# Patient Record
Sex: Female | Born: 1935 | Race: White | Hispanic: No | Smoking: Former smoker
Health system: Southern US, Community
[De-identification: ages and names within clinical notes are randomized; demographics above are authoritative.]

## PROBLEM LIST (undated history)

## (undated) DIAGNOSIS — S22080A Wedge compression fracture of T11-T12 vertebra, initial encounter for closed fracture: Secondary | ICD-10-CM

---

## 2016-07-14 ENCOUNTER — Other Ambulatory Visit: Payer: Self-pay | Admitting: Specialist

## 2016-07-14 DIAGNOSIS — S22080A Wedge compression fracture of T11-T12 vertebra, initial encounter for closed fracture: Secondary | ICD-10-CM

## 2016-07-15 ENCOUNTER — Ambulatory Visit
Admission: RE | Admit: 2016-07-15 | Discharge: 2016-07-15 | Disposition: A | Discharge status: Home / Self Care | Payer: Medicare Other | Source: Ambulatory Visit | Attending: Specialist | Admitting: Specialist

## 2016-07-15 DIAGNOSIS — S22080A Wedge compression fracture of T11-T12 vertebra, initial encounter for closed fracture: Secondary | ICD-10-CM

## 2016-07-15 HISTORY — DX: Wedge compression fracture of T11-T12 vertebra, initial encounter for closed fracture: S22.080A

## 2016-07-15 HISTORY — PX: IR GENERIC HISTORICAL: IMG1180011

## 2016-07-15 NOTE — Consult Note (Signed)
Chief Complaint  Patient presents with  . Advice Only    Consult for T11 compression fx/poss Kyphoplasty      Referring Physician(s): Hill,Edward  History of Present Illness: Brandi Dodson is a 83 y.o. female past medical history significant for osteopenia, who is known to the IR service after undergoing technically successful T8, T10 and T12 fluoroscopic guided kyphoplasties on 05/18/2016.  She returns today for evaluation and management for new symptomatic compression fractures. She is accompanied by her caregiver though serves as her on historian.  Patient states that she experienced a significant improvement in her back pain following the multilevel kyphoplasty performed on 05/18/2016. Unfortunately in early December while being transferred for the acquisition of the cervical spine radiographs, the patient experienced a sudden onset of new lower thoracic/upper lumbar back pain similar to her prior compression fractures.  The patient has been treated conservatively however given persistence of symptoms, a lumbar spine CT was obtained on 06/30/2016 demonstrating mild compression deformities involving the superior endplates of the T11 and L2 vertebral bodies.   The patient states that this pain is similar to her previous compression fractures. She rates her pain as 4-6 / 10 though states it acutely worsens with movement or ambulation.  She states that her pain is minimally improved with pain medicine though she does not like taking pain medicine as it affects her mentation.  The patient is otherwise without complaint. No fever or chills. No lower extremity weakness. No radiculopathy symptoms.    Past Medical History:  Diagnosis Date  . Closed wedge compression fracture of T11 vertebra (HCC)     No past surgical history on file.  Allergies: Penicillins; Amitriptyline; Amlodipine besylate; Ciprofloxacin; Codeine; Dicyclomine; Erythromycin; Feldene [piroxicam]; Histex [triprolidine hcl];  Influenza vaccines; Lyrica [pregabalin]; Macrobid [nitrofurantoin]; Mouthwashes; Myrbetriq [mirabegron]; Neurontin [gabapentin]; Novocain [procaine]; Phenylephrine-guaifenesin; Prednisolone; Prednisone; Propoxyphene; Spironolactone; Trovan [alatrofloxacin]; Amaranth (fd&c red #2); Clindamycin/lincomycin; Iodinated diagnostic agents; and Vancomycin  Medications: Prior to Admission medications   Medication Sig Start Date End Date Taking? Authorizing Provider  aspirin 81 MG EC tablet Take 81 mg by mouth daily. Swallow whole.   Yes Historical Provider, MD  atorvastatin (LIPITOR) 40 MG tablet Take 40 mg by mouth daily.   Yes Historical Provider, MD  butalbital-acetaminophen-caffeine (FIORICET WITH CODEINE) 50-325-40-30 MG capsule Take 1 capsule by mouth every 4 (four) hours as needed for headache.   Yes Historical Provider, MD  chlordiazePOXIDE (LIBRIUM) 5 MG capsule Take 5 mg by mouth 2 (two) times daily.   Yes Historical Provider, MD  clopidogrel (PLAVIX) 75 MG tablet Take 75 mg by mouth daily.   Yes Historical Provider, MD  CRANBERRY FRUIT CONCENTRATE PO Take 450 mg by mouth 2 (two) times daily.   Yes Historical Provider, MD  diazepam (VALIUM) 5 MG tablet Take 5 mg by mouth every 8 (eight) hours as needed for anxiety.   Yes Historical Provider, MD  hydrALAZINE (APRESOLINE) 25 MG tablet Take 25 mg by mouth 3 (three) times daily.   Yes Historical Provider, MD  hydrocortisone (CORTEF) 10 MG tablet Take 10 mg by mouth 2 (two) times daily.   Yes Historical Provider, MD  levothyroxine (SYNTHROID, LEVOTHROID) 112 MCG tablet Take 112 mcg by mouth daily before breakfast.   Yes Historical Provider, MD  metoprolol succinate (TOPROL-XL) 50 MG 24 hr tablet Take 50 mg by mouth 2 (two) times daily. Take with or immediately following a meal.   Yes Historical Provider, MD  Multiple Vitamins-Minerals (PRESERVISION AREDS 2 PO) Take 1  tablet by mouth daily.   Yes Historical Provider, MD  oxyCODONE-acetaminophen  (PERCOCET/ROXICET) 5-325 MG tablet Take by mouth every 4 (four) hours as needed for severe pain.   Yes Historical Provider, MD  PROPYLENE GLYCOL OP Apply 1 drop to eye daily as needed.   Yes Historical Provider, MD  senna (SENOKOT) 8.6 MG TABS tablet Take 1 tablet by mouth daily as needed for mild constipation.   Yes Historical Provider, MD     No family history on file.  Social History   Social History  . Marital status: Widowed    Spouse name: N/A  . Number of children: N/A  . Years of education: N/A   Social History Main Topics  . Smoking status: Former Smoker    Packs/day: 0.75    Years: 5.00    Types: Cigarettes    Start date: 07/16/1947    Quit date: 07/15/1952  . Smokeless tobacco: Never Used  . Alcohol use No  . Drug use: No  . Sexual activity: Not Asked   Other Topics Concern  . None   Social History Narrative  . None    ECOG Status: 2 - Symptomatic, <50% confined to bed  Review of Systems: A 12 point ROS discussed and pertinent positives are indicated in the HPI above.  All other systems are negative.  Review of Systems  Constitutional: Positive for activity change. Negative for appetite change, fatigue and fever.  Respiratory: Negative.   Cardiovascular: Negative.   Gastrointestinal: Negative.   Musculoskeletal: Positive for back pain and gait problem.    Vital Signs: BP (!) 214/82 (BP Location: Left Arm, Patient Position: Sitting, Cuff Size: Normal)   Pulse 74   Temp 98.1 F (36.7 C) (Oral)   Resp 17   Ht 5' (1.524 m)   Wt 111 lb (50.3 kg)   SpO2 95%   BMI 21.68 kg/m   Physical Exam  Constitutional: She appears well-developed and well-nourished.  HENT:  Head: Normocephalic and atraumatic.  Cardiovascular: Normal rate and regular rhythm.   Pulmonary/Chest: Effort normal and breath sounds normal.  Musculoskeletal:       Arms: Location of patient's back pain. Patient is most tender with palpation of the lower thoracic/upper lumbar spine.    Psychiatric: She has a normal mood and affect. Her behavior is normal.  Nursing note and vitals reviewed.  Imaging:  Lumbar spine CT - 06/30/2016.  Fluoro Guided T8, T10 and T12 kyphoplasty - 05/18/2016  Labs:  CBC: No results for input(s): WBC, HGB, HCT, PLT in the last 8760 hours.  COAGS: No results for input(s): INR, APTT in the last 8760 hours.  BMP: No results for input(s): NA, K, CL, CO2, GLUCOSE, BUN, CALCIUM, CREATININE, GFRNONAA, GFRAA in the last 8760 hours.  Invalid input(s): CMP   Assessment and Plan:  Gudrun Axe is a 83 y.o. female past medical history significant for osteopenia, who is known to the IR service after undergoing technically successful T8, T10 and T12 fluoroscopic guided kyphoplasties on 05/18/2016.    While the patient experienced significant improvement in her back pain following the multilevel kyphoplasty performed 05/18/2016, unfortunately, she likely developed new compression fractures involving the superior endplates of the T11 and L2 vertebral bodies while being transferred for cervical spine radiographs in early December. Despite current conservative management, the patient's lifestyle limiting pain has persisted with CT performed 06/30/2016 demonstrated mild compression deformities involving the superior endplates of T11 and L2, both of which are symptomatic on physical examination.  As  such, prolonged conversations were held with the patient regarding the risks and benefits of fluoroscopic guided kyphoplasty including, but not limited to education regarding the natural healing process of compression fractures without intervention, bleeding, infection, cement migration which may cause spinal cord damage, paralysis, pulmonary embolism or even death.  All of the patient's questions were answered, patient is agreeable to proceed.  The procedure will be performed at [facility], however the patient is on Plavix which will need to be held  a minimum of 5 days prior to the procedure. Per the patient's son (I discussed the procedure with the patient's son via telephone at the time of the encounter), previous procedures entailed hospitalization prior to the intervention for inpatient administration of anticoagulant given patient's stroke risk. This will be discussed and arranged with referring physician, Dr. Loleta Chance.  While the patient prefers to have the procedure performed by myself, she is willing to have one of my other capable interventional radiology partners perform this procedure if scheduling cannot be arranged.  Thank you for this interesting consult.  I greatly enjoyed meeting Faithe Ariola and look forward to participating in their care.  A copy of this report was sent to the requesting provider on this date.  Electronically Signed: Simonne Come 07/15/2016, 2:07 PM   I spent a total of 25 Minutes in face to face in clinical consultation, greater than 50% of which was counseling/coordinating care for symptomatic compression fractures.

## 2016-07-17 ENCOUNTER — Encounter: Payer: Self-pay | Admitting: Interventional Radiology

## 2016-08-04 ENCOUNTER — Other Ambulatory Visit (HOSPITAL_COMMUNITY): Payer: Self-pay | Admitting: Interventional Radiology

## 2016-08-04 DIAGNOSIS — S22000A Wedge compression fracture of unspecified thoracic vertebra, initial encounter for closed fracture: Secondary | ICD-10-CM

## 2016-08-06 ENCOUNTER — Other Ambulatory Visit (HOSPITAL_COMMUNITY): Payer: Self-pay | Admitting: Interventional Radiology

## 2016-08-06 ENCOUNTER — Encounter: Payer: Self-pay | Admitting: Radiology

## 2016-08-06 ENCOUNTER — Ambulatory Visit
Admission: RE | Admit: 2016-08-06 | Discharge: 2016-08-06 | Disposition: A | Discharge status: Home / Self Care | Payer: Medicare Other | Source: Ambulatory Visit | Attending: Interventional Radiology | Admitting: Interventional Radiology

## 2016-08-06 DIAGNOSIS — G8929 Other chronic pain: Secondary | ICD-10-CM

## 2016-08-06 DIAGNOSIS — M546 Pain in thoracic spine: Principal | ICD-10-CM

## 2016-08-06 DIAGNOSIS — S22000A Wedge compression fracture of unspecified thoracic vertebra, initial encounter for closed fracture: Secondary | ICD-10-CM

## 2016-08-06 HISTORY — PX: IR GENERIC HISTORICAL: IMG1180011

## 2016-08-06 NOTE — Progress Notes (Signed)
Referring Physician(s): Woodyear,W  Chief Complaint: The patient is seen in follow up today s/p T11 and L2 kyphoplasties on 07/31/16 [facility])  History of present illness: Brandi Dodson is a 83 y.o. female with past medical history significant for osteopenia, who is known to the IR service after undergoing technically successful T8, T10 and T12 fluoroscopic guided kyphoplasties on 05/18/2016. She presents again today for follow up for recent T11 and L2 kyphoplasties on 07/31/16. She endorses some 3/10 lower left back pain that is worse with movement that began 2 days after procedure. She is eating well.  Denies chest pain, shortness of breath, fevers, chills, nausea, or vomiting. She was recently diagnosed with UTI but was unable to purchase prescribed antibiotic due to excess cost. She has had no recent falls.   Past Medical History:  Diagnosis Date  . Closed wedge compression fracture of T11 vertebra [facility])     Past Surgical History:  Procedure Laterality Date  . IR GENERIC HISTORICAL  07/15/2016   IR RADIOLOGIST EVAL & MGMT 07/15/2016 Simonne Come, MD [facility] INTERV RAD  . IR GENERIC HISTORICAL  08/06/2016   IR RADIOLOGIST EVAL & MGMT 08/06/2016 Simonne Come, MD [facility] INTERV RAD    Allergies: Penicillins; Amitriptyline; Amlodipine besylate; Ciprofloxacin; Codeine; Dicyclomine; Erythromycin; Feldene [piroxicam]; Histex [triprolidine hcl]; Influenza vaccines; Lyrica [pregabalin]; Macrobid [nitrofurantoin]; Mouthwashes; Myrbetriq [mirabegron]; Neurontin [gabapentin]; Novocain [procaine]; Phenylephrine-guaifenesin; Prednisolone; Prednisone; Propoxyphene; Spironolactone; Trovan [alatrofloxacin]; Amaranth (fd&c red #2); Clindamycin/lincomycin; Iodinated diagnostic agents; and Vancomycin  Medications: Prior to Admission medications   Medication Sig Start Date End Date Taking? Authorizing Provider  aspirin 81 MG EC tablet Take 81 mg by mouth daily. Swallow whole.    Historical  Provider, MD  atorvastatin (LIPITOR) 40 MG tablet Take 40 mg by mouth daily.    Historical Provider, MD  butalbital-acetaminophen-caffeine (FIORICET WITH CODEINE) 50-325-40-30 MG capsule Take 1 capsule by mouth every 4 (four) hours as needed for headache.    Historical Provider, MD  chlordiazePOXIDE (LIBRIUM) 5 MG capsule Take 5 mg by mouth 2 (two) times daily.    Historical Provider, MD  clopidogrel (PLAVIX) 75 MG tablet Take 75 mg by mouth daily.    Historical Provider, MD  CRANBERRY FRUIT CONCENTRATE PO Take 450 mg by mouth 2 (two) times daily.    Historical Provider, MD  diazepam (VALIUM) 5 MG tablet Take 5 mg by mouth every 8 (eight) hours as needed for anxiety.    Historical Provider, MD  hydrALAZINE (APRESOLINE) 25 MG tablet Take 25 mg by mouth 3 (three) times daily.    Historical Provider, MD  hydrocortisone (CORTEF) 10 MG tablet Take 10 mg by mouth 2 (two) times daily.    Historical Provider, MD  levothyroxine (SYNTHROID, LEVOTHROID) 112 MCG tablet Take 112 mcg by mouth daily before breakfast.    Historical Provider, MD  metoprolol succinate (TOPROL-XL) 50 MG 24 hr tablet Take 50 mg by mouth 2 (two) times daily. Take with or immediately following a meal.    Historical Provider, MD  Multiple Vitamins-Minerals (PRESERVISION AREDS 2 PO) Take 1 tablet by mouth daily.    Historical Provider, MD  oxyCODONE-acetaminophen (PERCOCET/ROXICET) 5-325 MG tablet Take by mouth every 4 (four) hours as needed for severe pain.    Historical Provider, MD  PROPYLENE GLYCOL OP Apply 1 drop to eye daily as needed.    Historical Provider, MD  senna (SENOKOT) 8.6 MG TABS tablet Take 1 tablet by mouth daily as needed for mild constipation.    Historical  Provider, MD     No family history on file.  Social History   Social History  . Marital status: Widowed    Spouse name: N/A  . Number of children: N/A  . Years of education: N/A   Social History Main Topics  . Smoking status: Former Smoker    Packs/day:  0.75    Years: 5.00    Types: Cigarettes    Start date: 07/16/1947    Quit date: 07/15/1952  . Smokeless tobacco: Never Used  . Alcohol use No  . Drug use: No  . Sexual activity: Not on file   Other Topics Concern  . Not on file   Social History Narrative  . No narrative on file     Vital Signs: BP (!) 209/89 (BP Location: Left Arm, Patient Position: Sitting, Cuff Size: Normal)   Pulse 75   Temp 97.9 F (36.6 C) (Oral)   Resp 14   Ht 5' (1.524 m)   Wt 110 lb (49.9 kg)   SpO2 96%   BMI 21.48 kg/m   Physical Exam Awake and alert. Heart is regular rate and rhythm. Chest is clear to auscultation. Abdomen is soft, nontender, and bowel sounds are active. Incision sites are i/c/d with mild tenderness to palpation which is expected.  Imaging: Dg Thoracic Spine 2 View  Result Date: 08/06/2016 CLINICAL DATA:  Post recent T11 and L2 kyphoplasty with persistent back pain. EXAM: THORACIC SPINE 2 VIEWS COMPARISON:  Thoracic spine CT - 05/09/2016 ; lumbar spine CT -06/21/2019 FINDINGS: Post T8, T10, T11, T12 and L1 vertebral body cement augmentation. Normal alignment of the thoracic spine. Potential slight progression of now mild (under 25%) compression deformity involving the superior endplate of the T9 vertebral body. Remaining vertebral body heights appear unchanged. Calcified atherosclerotic plaque within the descending thoracic aorta. Limited visualization of the adjacent thorax demonstrates apparent minimal left basilar opacities, atelectasis versus infiltrate. IMPRESSION: 1. Potential slight progression of now mild (under 25%) compression deformity involving the superior endplate of the T9 vertebral body. Correlation for point tenderness at this location is recommended. Further evaluation with thoracic spine CT could be performed as clinically indicated. 2. Potential left basilar opacities within the incidentally imaged left lower lung, atelectasis versus infiltrate. 3. Aortic Atherosclerosis  (ICD10-170.0) Electronically Signed   By: Simonne ComeJohn  Watts M.D.   On: 08/06/2016 13:36   Dg Lumbar Spine 2-3 Views  Result Date: 08/06/2016 CLINICAL DATA:  Post T11 and L2 kyphoplasty with persistent back pain. EXAM: LUMBAR SPINE - 2-3 VIEW COMPARISON:  Thoracic spine radiographs-earlier same day; T11 and L2 kyphoplasty - 07/31/2016; lumbar spine CT - 06/30/2016 FINDINGS: Stable sequela of T9, T10, T11, T12, L1, L2 and L5 vertebral body augmentation. Mild scoliotic curvature of the thoracolumbar spine with dominant caudal component convex the right. Mild straightening expected lumbar lordosis. No anterolisthesis or retrolisthesis Mild (< 25%) compression deformities involving the superior endplates of the L3 and L4 vertebral bodies are unchanged. Limited visualization of the bilateral SI joints and hips is normal. Calcified atherosclerotic plaque when the abdominal aorta. Pacer leads overlie the lower thorax. Surgical clips overlie the left upper abdominal quadrant. IMPRESSION: 1. No acute findings. 2. Post multilevel vertebral body cement augmentation as above. 3. Unchanged mild (under 25%) compression deformities involving the superior endplates of the L3 and L4 vertebral bodies. Electronically Signed   By: Simonne ComeJohn  Watts M.D.   On: 08/06/2016 13:41   Ir Radiologist Eval & Mgmt  Result Date: 08/06/2016 Please refer to "Notes"  to see consult details.   Labs:  CBC: No results for input(s): WBC, HGB, HCT, PLT in the last 8760 hours.  COAGS: No results for input(s): INR, APTT in the last 8760 hours.  BMP: No results for input(s): NA, K, CL, CO2, GLUCOSE, BUN, CALCIUM, CREATININE, GFRNONAA, GFRAA in the last 8760 hours.  Invalid input(s): CMP  LIVER FUNCTION TESTS: No results for input(s): BILITOT, AST, ALT, ALKPHOS, PROT, ALBUMIN in the last 8760 hours.  Assessment:  Compression fractures of T11 and L2 , s/p T11 and L2 kyphoplasties on 07/31/16 [facility]). History of previous  T8/T10/T12 kyphoplasties in November 2017 as well. Imaging today shows resolution of T12 and L2 compression fractures and a potential compression deformity involving the superior endplate of the T9 vertebral body. She currently has lower back pain, but does not correlate to the findings above. Back pain may be secondary to recent UTI or of musculoskeletal origin. If pain significantly worsens  thoracic spine CT could be performed as clinically indicated. Encouraged patient to be careful with sudden movements and use walker to assist with ambulation, use alternating cool and warm compresses to back as needed.  Will defer UTI treatment to primary care team.    Signed: D. Caryn Bee Allred/ Katy Fitch 08/06/2016, 4:14 PM   Please refer to Dr. Kirt Boys attestation of this note for management and plan.      Patient ID: Sabel Hornbeck, female   DOB: 1922-04-14, 83 y.o.   MRN: [id number]

## 2017-08-24 IMAGING — CR DG LUMBAR SPINE 2-3V
2 series · 2 of 2 positions shown · non-contrast
Comparison: Thoracic spine radiographs-earlier same day; T11 and L2
kyphoplasty - 07/31/2016; lumbar spine CT - 06/30/2016

CLINICAL DATA: Post T11 and L2 kyphoplasty with persistent back
pain.

EXAM:
LUMBAR SPINE - 2-3 VIEW

[t l-spine a.p.]
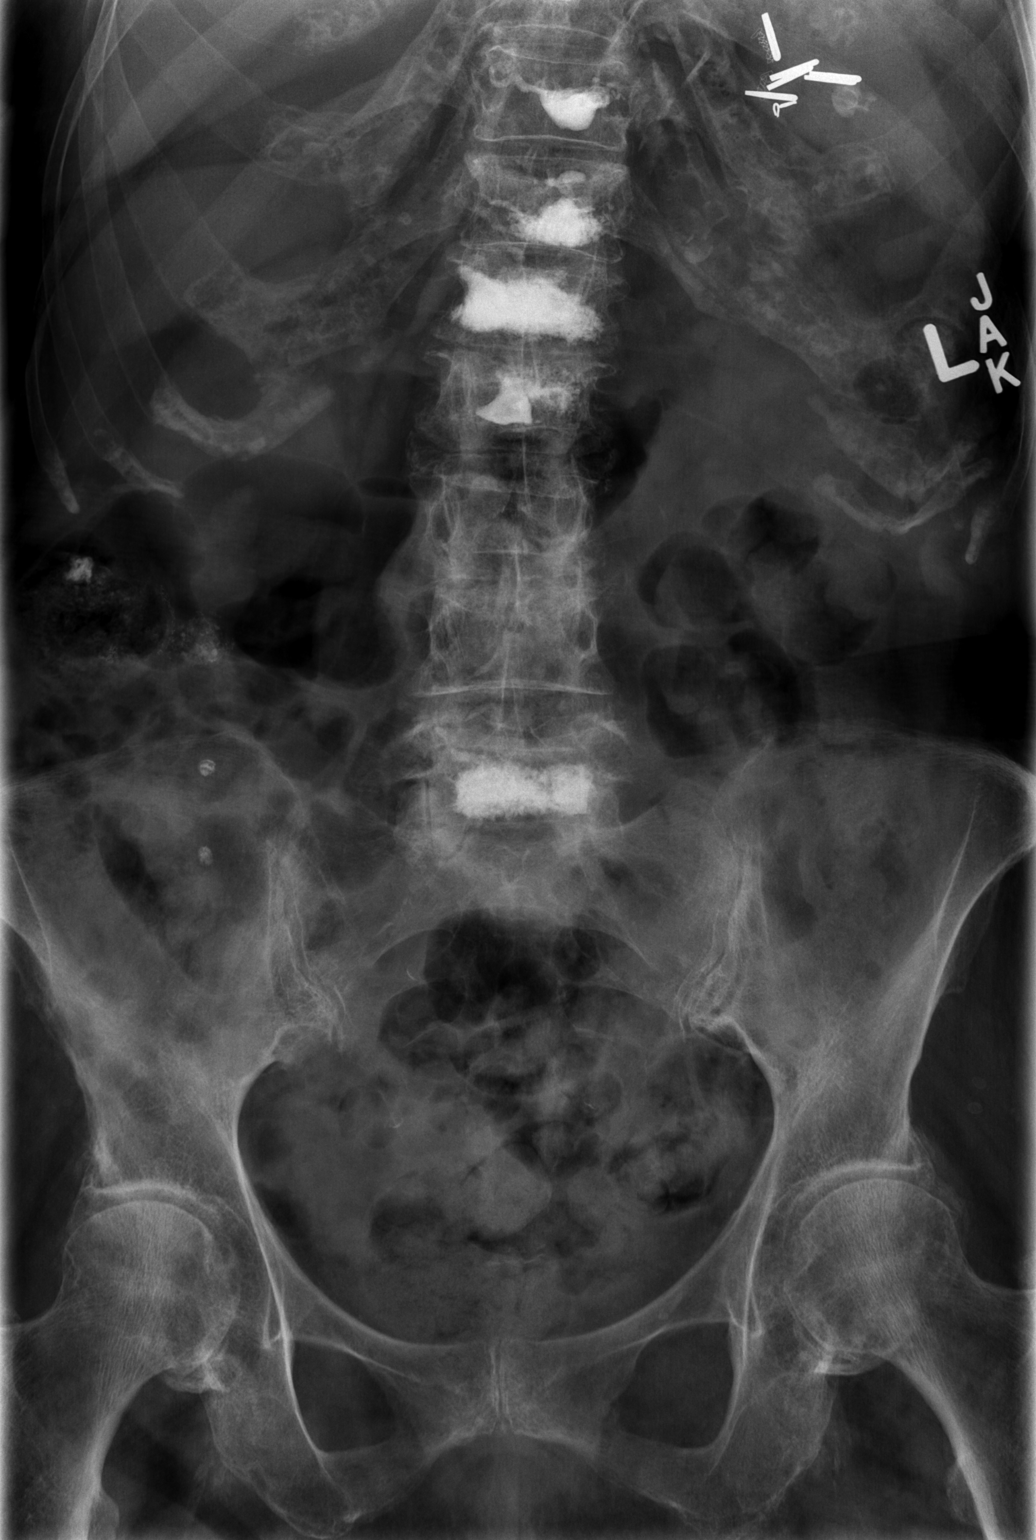

[t l-spine lat]
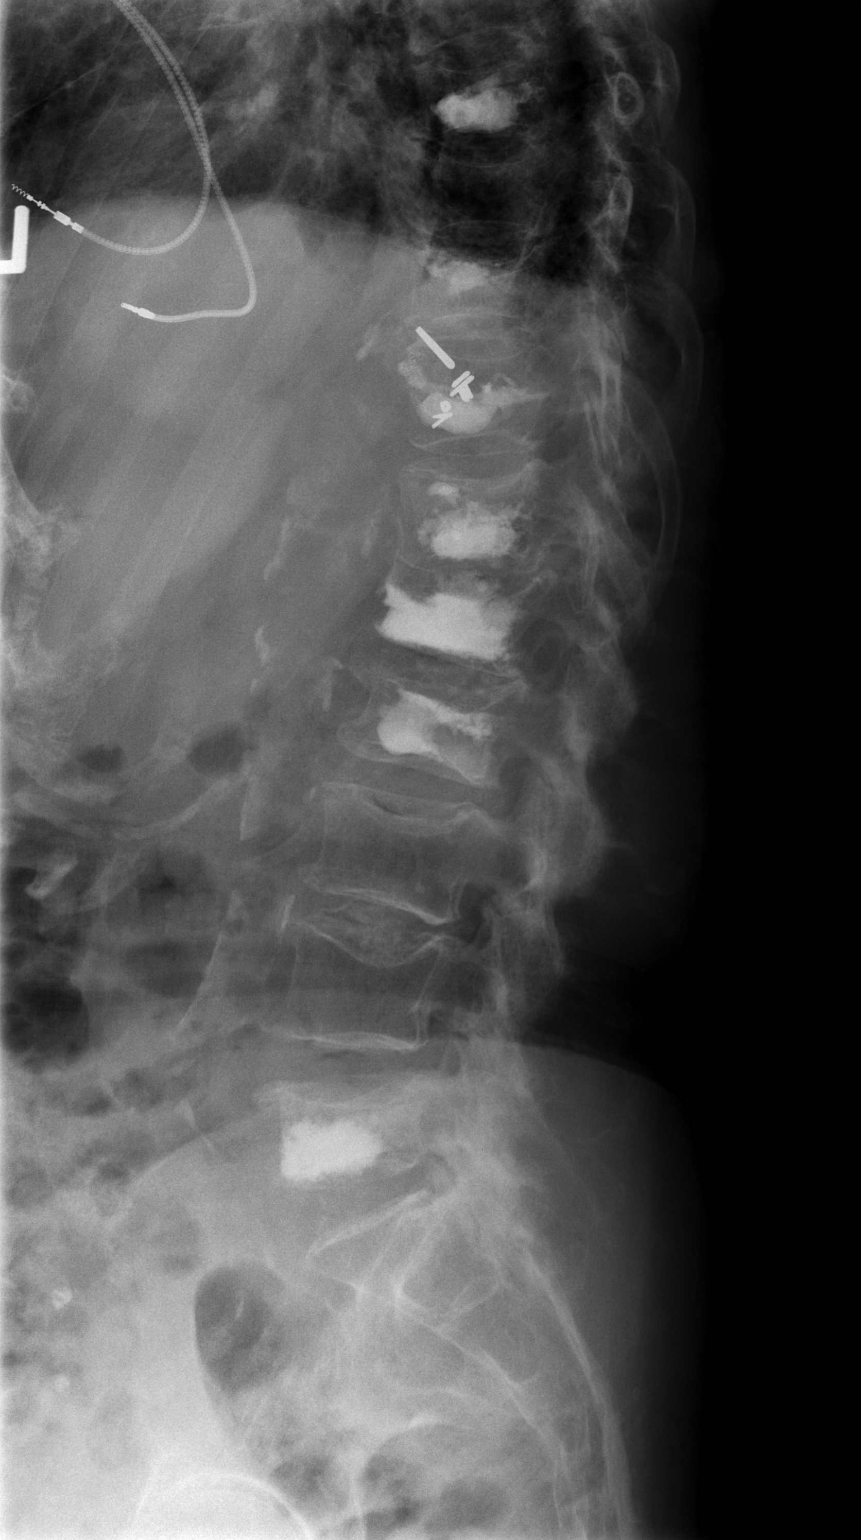

[2 of 2 positions shown; findings below may reference images not displayed]

FINDINGS: Stable sequela of T9, T10, T11, T12, L1, L2 and L5 vertebral body
augmentation.

Mild scoliotic curvature of the thoracolumbar spine with dominant
caudal component convex the right. Mild straightening expected
lumbar lordosis. No anterolisthesis or retrolisthesis

Mild (< 25%) compression deformities involving the superior
endplates of the L3 and L4 vertebral bodies are unchanged.

Limited visualization of the bilateral SI joints and hips is normal.

Calcified atherosclerotic plaque when the abdominal aorta. Pacer
leads overlie the lower thorax. Surgical clips overlie the left
upper abdominal quadrant.
IMPRESSION: 1. No acute findings.
2. Post multilevel vertebral body cement augmentation as above.
3. Unchanged mild (under 25%) compression deformities involving the
superior endplates of the L3 and L4 vertebral bodies.

## 2022-01-01 ENCOUNTER — Ambulatory Visit (INDEPENDENT_AMBULATORY_CARE_PROVIDER_SITE_OTHER): Payer: Medicare Other | Admitting: Internal Medicine

## 2022-01-01 ENCOUNTER — Ambulatory Visit: Payer: Medicare Other | Admitting: Internal Medicine

## 2022-01-01 ENCOUNTER — Encounter: Payer: Self-pay | Admitting: Internal Medicine

## 2022-01-01 VITALS — BP 136/70 | HR 72 | Temp 97.7°F | Resp 16 | Wt 112.3 lb

## 2022-01-01 DIAGNOSIS — L308 Other specified dermatitis: Secondary | ICD-10-CM

## 2022-01-01 MED ORDER — KETOCONAZOLE 2 % EX CREA: 1.0000 | TOPICAL_CREAM | Freq: Every day | CUTANEOUS | 3 refills | Status: AC

## 2022-01-01 NOTE — Patient Instructions (Addendum)
Dermatitis  -I am not concerned with eczema, contact dermatitis or any allergic rashes based on exam and history -Based on exam and history this is a yeast or fungal infection -There is no rash to examine today, however given you are not tolerating tacrolimus we should stop this ointment -Good skin care and keeping folds of skin dry will help prevent recurrence -I have prescribed a topical antifungal ketoconazole 2% cream should symptoms recur  Follow up: as needed   Thank you so much for letting me partake in your care today.  Don't hesitate to reach out if you have any additional concerns!  Ferol Luz, MD  [facility], [place]

## 2022-01-01 NOTE — Progress Notes (Signed)
New Patient Note  RE: Nobuko Gsell MRN: [id number] DOB: 1921/11/15 Date of Office Visit: 01/01/2022  Consult requested by: No ref. provider found Primary care provider: No primary care provider on file.  Chief Complaint: No chief complaint on file.  History of Present Illness: I had the pleasure of seeing Cloria Ciresi for initial evaluation at the [facility] on 01/01/2022. She is a 88 y.o. female, who is referred here by No primary care provider on file. for the evaluation of rash .  History obtained from patient  and  driver from Nursing Home  .  History is slightly unclear as records indicate a rash dating back to 2020.  Patient reports her nurse said rash started a year ago and patient reports she noticed about 1 week ago.  Her records indicate she was started on tacrolimus 0.1% ointment on 12/23/2021.  Nursing home aide did report she had increased erythema on her neck which is since resolved.  Patient reports she has had this persistent erythematous pruritic rash underneath her breasts and folds of skin and along neck.  She has a longstanding history of "allergies" dating back to age 57. Reports tacrolimus burns when applied.  Also reports she stopped wearing a bra months ago due to painful underbreast rash.     She has an extensive drug allergy list as well to: Amitriptyline, amlodipine, clindamycin, codeine, dicyclomine, doxycycline, erythromycin, gabapentin, Myrabegron, morphine, paroxetine, prednisone, pregabalin, procaine, propoxyphene, spironolactone, tramadol, vancomycin, Depo-Medrol, Feldene, histric, Lyrica, Novocain, influenza vaccines, penicillins, allergy trovafloxacin, prophenamine phenylephrine, IV dyes (red and blue), guaifenesin  Assessment and Plan: Shelah is a 88 y.o. female with: Other specified dermatitis Plan: Patient Instructions   Dermatitis  -I am not concerned with eczema, contact dermatitis or any allergic rashes based on exam and history -Based  on exam and history this is a yeast or fungal infection -There is no rash to examine today, however given you are not tolerating tacrolimus we should stop this ointment -Good skin care and keeping folds of skin dry will help prevent recurrence -I have prescribed a topical antifungal ketoconazole 2% cream should symptoms recur  Follow up: as needed   Thank you so much for letting me partake in your care today.  Don't hesitate to reach out if you have any additional concerns!  Ferol Luz, MD  [facility] [place] [place]  No follow-ups on file.  Meds ordered this encounter  Medications   ketoconazole (NIZORAL) 2 % cream    Sig: Apply 1 Application topically daily.    Dispense:  15 g    Refill:  3   Lab Orders  No laboratory test(s) ordered today    Other allergy screening: Asthma: no Rhino conjunctivitis: no Food allergy: no Medication allergy: yes Hymenoptera allergy: no Urticaria: no Eczema:no History of recurrent infections suggestive of immunodeficency: no  Diagnostics: None performed   Past Medical History: There are no problems to display for this patient.  Past Medical History:  Diagnosis Date   Closed wedge compression fracture of T11 vertebra [facility])    Past Surgical History: Past Surgical History:  Procedure Laterality Date   IR GENERIC HISTORICAL  07/15/2016   IR RADIOLOGIST EVAL & MGMT 07/15/2016 Simonne Come, MD [facility] INTERV RAD   IR GENERIC HISTORICAL  08/06/2016   IR RADIOLOGIST EVAL & MGMT 08/06/2016 Simonne Come, MD [facility] INTERV RAD   Medication List:  Current Outpatient Medications  Medication Sig Dispense Refill   atorvastatin (LIPITOR)  40 MG tablet Take 40 mg by mouth daily.     butalbital-acetaminophen-caffeine (FIORICET WITH CODEINE) 50-325-40-30 MG capsule Take 1 capsule by mouth every 4 (four) hours as needed for headache.     chlordiazePOXIDE (LIBRIUM) 5 MG capsule Take 5 mg by mouth 2 (two) times daily.     clopidogrel  (PLAVIX) 75 MG tablet Take 75 mg by mouth daily.     CRANBERRY FRUIT CONCENTRATE PO Take 450 mg by mouth 2 (two) times daily.     diazepam (VALIUM) 5 MG tablet Take 5 mg by mouth every 8 (eight) hours as needed for anxiety.     hydrALAZINE (APRESOLINE) 25 MG tablet Take 25 mg by mouth 3 (three) times daily.     hydrocortisone (CORTEF) 10 MG tablet Take 10 mg by mouth 2 (two) times daily.     ketoconazole (NIZORAL) 2 % cream Apply 1 Application topically daily. 15 g 3   levothyroxine (SYNTHROID, LEVOTHROID) 112 MCG tablet Take 112 mcg by mouth daily before breakfast.     metoprolol succinate (TOPROL-XL) 50 MG 24 hr tablet Take 50 mg by mouth 2 (two) times daily. Take with or immediately following a meal.     Multiple Vitamins-Minerals (PRESERVISION AREDS 2 PO) Take 1 tablet by mouth daily.     oxyCODONE-acetaminophen (PERCOCET/ROXICET) 5-325 MG tablet Take by mouth every 4 (four) hours as needed for severe pain.     PROPYLENE GLYCOL OP Apply 1 drop to eye daily as needed.     senna (SENOKOT) 8.6 MG TABS tablet Take 1 tablet by mouth daily as needed for mild constipation.     aspirin 81 MG EC tablet Take 81 mg by mouth daily. Swallow whole.     No current facility-administered medications for this visit.   Allergies: Allergies  Allergen Reactions   Penicillins Anaphylaxis   Amitriptyline    Amlodipine Besylate Swelling   Ciprofloxacin Other (See Comments)    Pt refuses to take Cipro b/c multiple family members are allergic to this medication.   Codeine Nausea Only   Dicyclomine    Erythromycin Nausea Only   Feldene [Piroxicam] Swelling   Histex [Triprolidine Hcl] Nausea Only   Influenza Vaccines    Lyrica [Pregabalin]    Macrobid [Nitrofurantoin] Nausea Only and Other (See Comments)    Headache    Mouthwashes     ? Magic mouthwash    Myrbetriq [Mirabegron]    Neurontin [Gabapentin] Other (See Comments)   Novocain [Procaine] Other (See Comments)    Angioedema     Phenylephrine-Guaifenesin    Prednisolone Hypertension   Prednisone    Propoxyphene    Spironolactone    Trovan [Alatrofloxacin]    Amaranth (Fd&C Red #2) Rash   Clindamycin/Lincomycin Rash   Iodinated Contrast Media Rash   Vancomycin Rash and Other (See Comments)   Social History: Social History   Socioeconomic History   Marital status: Widowed    Spouse name: Not on file   Number of children: Not on file   Years of education: Not on file   Highest education level: Not on file  Occupational History   Not on file  Tobacco Use   Smoking status: Former    Packs/day: 0.75    Years: 5.00    Total pack years: 3.75    Types: Cigarettes    Start date: 07/16/1947    Quit date: 07/15/1952    Years since quitting: 69.5   Smokeless tobacco: Never  Substance and Sexual Activity  Alcohol use: No   Drug use: No   Sexual activity: Not on file  Other Topics Concern   Not on file  Social History Narrative   Not on file   Social Determinants of Health   Financial Resource Strain: Not on file  Food Insecurity: Not on file  Transportation Needs: Not on file  Physical Activity: Not on file  Stress: Not on file  Social Connections: Not on file   Lives in a skilled nursing facility.  There are no roaches in the facility but is 2 feet off the floor.  There dust mite precautions on bed and pillows.  She is not exposed to fumes, chemicals or dust and there is a HEPA filter in her room.. Smoking: Never smoked Occupation: Retired  Landscape architect HistorySurveyor, minerals in the house: no Engineer, civil (consulting) in the family room: no Carpet in the bedroom: no Heating: electric Cooling: central Pet: no  Family History: History reviewed. No pertinent family history.   ROS: All others negative except as noted per HPI.   Objective: BP 136/70   Pulse 72   Temp 97.7 F (36.5 C) (Temporal)   Resp 16   Wt 112 lb 4.8 oz (50.9 kg)   SpO2 95%   BMI 21.93 kg/m  Body mass index is 21.93  kg/m.  General Appearance:  Alert, cooperative, no distress, appears stated age  Head:  Normocephalic, without obvious abnormality, atraumatic  Eyes:  Conjunctiva clear, EOM's intact  Nose: Nares normal,   Throat: Lips, tongue normal; teeth and gums normal,   Neck: Supple, symmetrical  Lungs:   , Respirations unlabored, intermittent dry coughing  Heart:  , Appears well perfused  Extremities: No edema  Skin: Skin color, texture, turgor normal, slight erythema on bilateral sides of her neck, no flaking or dermatitis.  Multiple skin folds on abdomen and under breasts with no evidence of dermatitis  Neurologic: No gross deficits   The plan was reviewed with the patient/family, and all questions/concerned were addressed.  It was my pleasure to see Imagene today and participate in her care. Please feel free to contact me with any questions or concerns.  Sincerely,  Ferol Luz, MD Allergy & Immunology  [facility] office: [phone] [facility] office: [phone]

## 2022-01-05 ENCOUNTER — Ambulatory Visit: Payer: Medicare Other | Admitting: Internal Medicine
